# Patient Record
Sex: Male | Born: 1950 | Hispanic: No | Marital: Married | Smoking: Former smoker
Health system: Southern US, Community
[De-identification: ages and names within clinical notes are randomized; demographics above are authoritative.]

## PROBLEM LIST (undated history)

## (undated) DIAGNOSIS — E119 Type 2 diabetes mellitus without complications: Secondary | ICD-10-CM

## (undated) DIAGNOSIS — I10 Essential (primary) hypertension: Secondary | ICD-10-CM

## (undated) DIAGNOSIS — C801 Malignant (primary) neoplasm, unspecified: Secondary | ICD-10-CM

## (undated) HISTORY — PX: PROSTATE SURGERY: SHX751

## (undated) HISTORY — PX: HEART TRANSPLANT: SHX268

---

## 2005-04-10 ENCOUNTER — Other Ambulatory Visit: Payer: Self-pay

## 2005-04-11 ENCOUNTER — Inpatient Hospital Stay: Payer: Self-pay | Admitting: Internal Medicine

## 2006-10-02 ENCOUNTER — Ambulatory Visit: Payer: Self-pay | Admitting: Cardiology

## 2006-10-02 ENCOUNTER — Inpatient Hospital Stay: Payer: Self-pay | Admitting: Psychiatry

## 2006-10-02 ENCOUNTER — Other Ambulatory Visit: Payer: Self-pay

## 2006-10-03 ENCOUNTER — Other Ambulatory Visit: Payer: Self-pay

## 2006-12-20 ENCOUNTER — Ambulatory Visit: Payer: Self-pay | Admitting: Pain Medicine

## 2007-01-05 ENCOUNTER — Ambulatory Visit: Payer: Self-pay | Admitting: Pain Medicine

## 2008-04-05 ENCOUNTER — Ambulatory Visit: Payer: Self-pay | Admitting: Internal Medicine

## 2008-05-01 ENCOUNTER — Ambulatory Visit: Payer: Self-pay | Admitting: Internal Medicine

## 2012-03-25 ENCOUNTER — Ambulatory Visit: Payer: Self-pay | Admitting: Medical

## 2013-08-18 ENCOUNTER — Ambulatory Visit: Payer: Self-pay | Admitting: Otolaryngology

## 2013-08-18 LAB — CREATININE, SERUM
CREATININE: 1.25 mg/dL (ref 0.60–1.30)
EGFR (African American): >60
EGFR (Non-African Amer.): >60

## 2013-09-15 ENCOUNTER — Ambulatory Visit: Payer: Self-pay | Admitting: Otolaryngology

## 2020-11-12 ENCOUNTER — Ambulatory Visit
Admission: RE | Admit: 2020-11-12 | Discharge: 2020-11-12 | Disposition: A | Discharge status: Home / Self Care | Payer: Medicare Other | Source: Ambulatory Visit

## 2020-11-12 ENCOUNTER — Other Ambulatory Visit: Payer: Self-pay

## 2020-11-12 VITALS — BP 177/76 | HR 90 | Temp 98.9°F | Resp 18 | Ht 64.0 in | Wt 180.0 lb

## 2020-11-12 DIAGNOSIS — H6123 Impacted cerumen, bilateral: Secondary | ICD-10-CM | POA: Diagnosis not present

## 2020-11-12 HISTORY — DX: Type 2 diabetes mellitus without complications: E11.9

## 2020-11-12 HISTORY — DX: Malignant (primary) neoplasm, unspecified: C80.1

## 2020-11-12 HISTORY — DX: Essential (primary) hypertension: I10

## 2020-11-12 NOTE — ED Triage Notes (Signed)
Pt states he went to get hearing aids and was told he had ear wax build up. He states they feel full but the right is worse. No pain.

## 2020-11-12 NOTE — ED Provider Notes (Signed)
[facility]    CSN: [id number] Arrival date & time: 11/12/20  1701      History   Chief Complaint Chief Complaint  Patient presents with   Ear Fullness    bilateral    HPI Manuel Fritz is a 70 y.o. male presenting to have an otic lavage of bilateral ears.  Patient states that he has new hearing aids coming in next week and was advised to have his ears "professionally cleaned."  He says that he has cleaned them out himself at home and is got quite a lot of wax out but believes there might still be some wax in his ears.  He says he has occasional fullness but denies any pain.  No other concerns.  HPI  Past Medical History:  Diagnosis Date   Cancer ([place])    Diabetes mellitus without complication ([place])    Hypertension     There are no problems to display for this patient.   Past Surgical History:  Procedure Laterality Date   HEART TRANSPLANT     PROSTATE SURGERY         Home Medications    Prior to Admission medications   Medication Sig Start Date End Date Taking? Authorizing Provider  amLODipine (NORVASC) 5 MG tablet Take 5 mg by mouth daily. 07/22/20  Yes [provider]  BRILINTA 90 MG TABS tablet Take 90 mg by mouth 2 (two) times daily. 07/22/20  Yes [provider]  cyanocobalamin 1000 MCG tablet Take by mouth.   Yes [provider]  HUMALOG KWIKPEN 100 UNIT/ML KwikPen SMARTSIG:6-10 Unit(s) SUB-Q 3 Times Daily 07/22/20  Yes [provider]  LANTUS SOLOSTAR 100 UNIT/ML Solostar Pen SMARTSIG:18 Unit(s) SUB-Q Every Night 07/22/20  Yes [provider]  levothyroxine (SYNTHROID) 50 MCG tablet Take by mouth. 07/26/20  Yes [provider]  liothyronine (CYTOMEL) 5 MCG tablet Take 5 mcg by mouth every morning. 07/26/20  Yes [provider]  losartan (COZAAR) 50 MG tablet Take 50 mg by mouth daily. 08/13/20  Yes [provider]  Multiple Vitamin (MULTI-VITAMIN) tablet Take 1 tablet by mouth  every morning.   Yes [provider]  oxyCODONE (OXY IR/ROXICODONE) 5 MG immediate release tablet Take by mouth. 10/15/20 11/14/20 Yes [provider]  OXYCONTIN 20 MG 12 hr tablet Take 20 mg by mouth 2 (two) times daily. 09/28/20  Yes [provider]  PARoxetine (PAXIL) 40 MG tablet Take 40 mg by mouth daily. 08/09/20  Yes [provider]  predniSONE (DELTASONE) 2.5 MG tablet Take 1.5 mg by mouth daily. 07/22/20  Yes [provider]  pregabalin (LYRICA) 75 MG capsule Take by mouth. 10/23/20 10/23/21 Yes [provider]  propranolol (INDERAL) 20 MG tablet Take 20 mg by mouth 2 (two) times daily. 07/22/20  Yes [provider]  rosuvastatin (CRESTOR) 10 MG tablet Take 20 mg by mouth daily. 08/12/20  Yes [provider]  sirolimus (RAPAMUNE) 2 MG tablet Take 2 mg by mouth daily. 10/15/20  Yes [provider]  tacrolimus (PROGRAF) 1 MG capsule Take by mouth. 09/20/20 09/21/21 Yes [provider]  tamsulosin (FLOMAX) 0.4 MG CAPS capsule Take 0.4 mg by mouth daily. 08/08/20  Yes [provider]  tolterodine (DETROL LA) 4 MG 24 hr capsule Take 4 mg by mouth 2 (two) times daily. 08/09/20  Yes [provider]  traZODone (DESYREL) 150 MG tablet Take 150 mg by mouth at bedtime. 08/09/20  Yes [provider]  Family History No family history on file.  Social History Social History   Tobacco Use   Smoking status: Former    Types: Cigarettes   Smokeless tobacco: Never  Vaping Use   Vaping Use: Never used  Substance Use Topics   Alcohol use: Not Currently     Allergies   Ace inhibitors and Erythromycin   Review of Systems Review of Systems  HENT:  Positive for ear pain and hearing loss. Negative for ear discharge.   Neurological:  Negative for dizziness and headaches.    Physical Exam Triage Vital Signs ED Triage Vitals  Enc Vitals Group     BP 11/12/20 1733 (!) 177/76     Pulse Rate  11/12/20 1733 90     Resp 11/12/20 1733 18     Temp 11/12/20 1733 98.9 F (37.2 C)     Temp Source 11/12/20 1733 Oral     SpO2 11/12/20 1733 96 %     Weight 11/12/20 1724 180 lb (81.6 kg)     Height 11/12/20 1724 5\' 4"  (1.626 m)     Head Circumference --      Peak Flow --      Pain Score 11/12/20 1724 0     Pain Loc --      Pain Edu? --      Excl. in [place]? --    No data found.  Updated Vital Signs BP (!) 177/76 (BP Location: Left Arm)   Pulse 90   Temp 98.9 F (37.2 C) (Oral)   Resp 18   Ht 5\' 4"  (1.626 m)   Wt 180 lb (81.6 kg)   SpO2 96%   BMI 30.90 kg/m      Physical Exam Vitals and nursing note reviewed.  Constitutional:      General: He is not in acute distress.    Appearance: Normal appearance. He is well-developed. He is not ill-appearing.  HENT:     Head: Normocephalic and atraumatic.     Right Ear: Tympanic membrane and external ear normal.     Left Ear: Tympanic membrane and external ear normal.     Ears:     Comments: Small amount of cerumen in bilateral EACs not obstructing view of TMs Eyes:     General: No scleral icterus.    Conjunctiva/sclera: Conjunctivae normal.  Cardiovascular:     Rate and Rhythm: Normal rate and regular rhythm.  Pulmonary:     Effort: Pulmonary effort is normal. No respiratory distress.  Musculoskeletal:     Cervical back: Neck supple.  Skin:    General: Skin is warm and dry.  Neurological:     General: No focal deficit present.     Mental Status: He is alert. Mental status is at baseline.     Motor: No weakness.     Gait: Gait normal.  Psychiatric:        Mood and Affect: Mood normal.        Thought Content: Thought content normal.     UC Treatments / Results  Labs (all labs ordered are listed, but only abnormal results are displayed) Labs Reviewed - No data to display  EKG   Radiology No results found.  Procedures Procedures (including critical care time)  Medications Ordered in UC Medications - No data  to display  Initial Impression / Assessment and Plan / UC Course  I have reviewed the triage vital signs and the nursing notes.  Pertinent labs & imaging results that were available  during my care of the patient were reviewed by me and considered in my medical decision making (see chart for details).  70 year old male presenting for otic lavage.  Patient has small amount of cerumen in bilateral EACs not obstructing view of TM which is normal-appearing.  Otic lavage performed and cerumen removed.  I did use a cotton tip applicator on the left EAC to remove the small amount of cerumen.  Patient tolerated well.  Advised to follow-up as needed.   Final Clinical Impressions(s) / UC Diagnoses   Final diagnoses:  Cerumen debris on tympanic membrane of both ears   Discharge Instructions   None    ED Prescriptions   None    PDMP not reviewed this encounter.   Danton Clap, PA-C 11/12/20 1836

## 2021-02-12 ENCOUNTER — Other Ambulatory Visit: Payer: Self-pay | Admitting: Orthopedic Surgery

## 2021-02-12 DIAGNOSIS — M4856XA Collapsed vertebra, not elsewhere classified, lumbar region, initial encounter for fracture: Secondary | ICD-10-CM

## 2021-02-12 DIAGNOSIS — M4807 Spinal stenosis, lumbosacral region: Secondary | ICD-10-CM

## 2021-02-12 DIAGNOSIS — M4316 Spondylolisthesis, lumbar region: Secondary | ICD-10-CM

## 2021-02-13 ENCOUNTER — Other Ambulatory Visit: Payer: Self-pay | Admitting: Orthopedic Surgery

## 2021-02-13 DIAGNOSIS — M4807 Spinal stenosis, lumbosacral region: Secondary | ICD-10-CM

## 2021-02-19 ENCOUNTER — Ambulatory Visit
Admission: RE | Admit: 2021-02-19 | Discharge: 2021-02-19 | Disposition: A | Discharge status: Home / Self Care | Payer: Medicare Other | Source: Ambulatory Visit | Attending: Orthopedic Surgery | Admitting: Orthopedic Surgery

## 2021-02-19 ENCOUNTER — Other Ambulatory Visit: Payer: Self-pay

## 2021-02-19 DIAGNOSIS — M4807 Spinal stenosis, lumbosacral region: Secondary | ICD-10-CM | POA: Diagnosis present

## 2021-02-22 ENCOUNTER — Other Ambulatory Visit: Payer: Self-pay

## 2021-02-22 ENCOUNTER — Ambulatory Visit
Admission: RE | Admit: 2021-02-22 | Discharge: 2021-02-22 | Disposition: A | Discharge status: Home / Self Care | Payer: Medicare Other | Source: Ambulatory Visit | Attending: Orthopedic Surgery | Admitting: Orthopedic Surgery

## 2021-02-22 DIAGNOSIS — M4807 Spinal stenosis, lumbosacral region: Secondary | ICD-10-CM | POA: Insufficient documentation

## 2021-03-12 ENCOUNTER — Ambulatory Visit
Admission: RE | Admit: 2021-03-12 | Discharge: 2021-03-12 | Disposition: A | Discharge status: Home / Self Care | Payer: Medicare Other | Source: Ambulatory Visit | Attending: Orthopedic Surgery | Admitting: Orthopedic Surgery

## 2021-03-12 ENCOUNTER — Other Ambulatory Visit: Payer: Self-pay

## 2021-03-12 DIAGNOSIS — M4807 Spinal stenosis, lumbosacral region: Secondary | ICD-10-CM | POA: Diagnosis not present

## 2021-03-12 DIAGNOSIS — M4856XA Collapsed vertebra, not elsewhere classified, lumbar region, initial encounter for fracture: Secondary | ICD-10-CM | POA: Insufficient documentation

## 2021-03-12 DIAGNOSIS — M4316 Spondylolisthesis, lumbar region: Secondary | ICD-10-CM | POA: Insufficient documentation

## 2021-05-05 ENCOUNTER — Encounter: Payer: Self-pay | Admitting: Licensed Clinical Social Worker

## 2021-05-05 ENCOUNTER — Ambulatory Visit
Admission: EM | Admit: 2021-05-05 | Discharge: 2021-05-05 | Disposition: A | Discharge status: Home / Self Care | Payer: Medicare Other | Attending: Emergency Medicine | Admitting: Emergency Medicine

## 2021-05-05 DIAGNOSIS — N390 Urinary tract infection, site not specified: Secondary | ICD-10-CM | POA: Insufficient documentation

## 2021-05-05 LAB — URINALYSIS, COMPLETE (UACMP) WITH MICROSCOPIC
Bilirubin Urine: NEGATIVE
Glucose, UA: 100 mg/dL — AB
Nitrite: POSITIVE — AB
Protein, ur: >300 mg/dL — AB
RBC / HPF: >50 RBC/hpf (ref 0–5)
Specific Gravity, Urine: 1.025 (ref 1.005–1.030)
WBC, UA: >50 WBC/hpf (ref 0–5)
pH: 5.5 (ref 5.0–8.0)

## 2021-05-05 MED ORDER — CEPHALEXIN 500 MG PO CAPS: 500.0000 mg | ORAL_CAPSULE | Freq: Two times a day (BID) | ORAL | 0 refills | Status: AC

## 2021-05-05 MED ORDER — PHENAZOPYRIDINE HCL 200 MG PO TABS: 200.0000 mg | ORAL_TABLET | Freq: Three times a day (TID) | ORAL | 0 refills | Status: DC

## 2021-05-05 NOTE — ED Provider Notes (Signed)
[facility]    CSN: [id number] Arrival date & time: 05/05/21  1428      History   Chief Complaint Chief Complaint  Patient presents with   Urinary Frequency    HPI Manuel Fritz is a 71 y.o. male.   HPI  71 year old male here for evaluation of urinary complaints.  Patient reports that he woke up this morning with a feeling of urinary urgency and significant bladder pressure but whenever he tries to void he only goes a few drops.  He states that his symptoms also include seeing some blood on the pad he wears in his underwear this morning.  He denies any burning with urination, fever, nausea, or vomiting.  He has back pain at baseline but has not noticed any increase.  Past Medical History:  Diagnosis Date   Cancer ([place])    Diabetes mellitus without complication ([place])    Hypertension     There are no problems to display for this patient.   Past Surgical History:  Procedure Laterality Date   HEART TRANSPLANT     PROSTATE SURGERY         Home Medications    Prior to Admission medications   Medication Sig Start Date End Date Taking? Authorizing Provider  cephALEXin (KEFLEX) 500 MG capsule Take 1 capsule (500 mg total) by mouth 2 (two) times daily for 7 days. 05/05/21 05/12/21 Yes Margarette Canada, NP  phenazopyridine (PYRIDIUM) 200 MG tablet Take 1 tablet (200 mg total) by mouth 3 (three) times daily. 05/05/21  Yes Margarette Canada, NP  amLODipine (NORVASC) 5 MG tablet Take 5 mg by mouth daily. 07/22/20   [provider]  BRILINTA 90 MG TABS tablet Take 90 mg by mouth 2 (two) times daily. 07/22/20   [provider]  cyanocobalamin 1000 MCG tablet Take by mouth.    [provider]  HUMALOG KWIKPEN 100 UNIT/ML KwikPen SMARTSIG:6-10 Unit(s) SUB-Q 3 Times Daily 07/22/20   [provider]  LANTUS SOLOSTAR 100 UNIT/ML Solostar Pen SMARTSIG:18 Unit(s) SUB-Q Every Night 07/22/20   [provider]  levothyroxine (SYNTHROID) 50 MCG  tablet Take by mouth. 07/26/20   [provider]  liothyronine (CYTOMEL) 5 MCG tablet Take 5 mcg by mouth every morning. 07/26/20   [provider]  losartan (COZAAR) 50 MG tablet Take 50 mg by mouth daily. 08/13/20   [provider]  Multiple Vitamin (MULTI-VITAMIN) tablet Take 1 tablet by mouth every morning.    [provider]  OXYCONTIN 20 MG 12 hr tablet Take 20 mg by mouth 2 (two) times daily. 09/28/20   [provider]  PARoxetine (PAXIL) 40 MG tablet Take 40 mg by mouth daily. 08/09/20   [provider]  predniSONE (DELTASONE) 2.5 MG tablet Take 1.5 mg by mouth daily. 07/22/20   [provider]  pregabalin (LYRICA) 75 MG capsule Take by mouth. 10/23/20 10/23/21  [provider]  propranolol (INDERAL) 20 MG tablet Take 20 mg by mouth 2 (two) times daily. 07/22/20   [provider]  rosuvastatin (CRESTOR) 10 MG tablet Take 20 mg by mouth daily. 08/12/20   [provider]  sirolimus (RAPAMUNE) 2 MG tablet Take 2 mg by mouth daily. 10/15/20   [provider]  tacrolimus (PROGRAF) 1 MG capsule Take by mouth. 09/20/20 09/21/21  [provider]  tamsulosin (FLOMAX) 0.4 MG CAPS capsule Take 0.4 mg by mouth daily. 08/08/20   [provider]  tolterodine (DETROL LA) 4 MG 24  hr capsule Take 4 mg by mouth 2 (two) times daily. 08/09/20   [provider]  traZODone (DESYREL) 150 MG tablet Take 150 mg by mouth at bedtime. 08/09/20   [provider]    Family History History reviewed. No pertinent family history.  Social History Social History   Tobacco Use   Smoking status: Former    Types: Cigarettes   Smokeless tobacco: Never  Vaping Use   Vaping Use: Never used  Substance Use Topics   Alcohol use: Not Currently     Allergies   Ace inhibitors and Erythromycin   Review of Systems Review of Systems  Constitutional:  Negative for activity change, appetite change and  fever.  Gastrointestinal:  Negative for nausea and vomiting.  Genitourinary:  Positive for hematuria and urgency. Negative for dysuria and frequency.  Musculoskeletal:  Negative for back pain.  Hematological: Negative.   Psychiatric/Behavioral: Negative.      Physical Exam Triage Vital Signs ED Triage Vitals [05/05/21 1536]  Enc Vitals Group     BP      Pulse      Resp      Temp      Temp src      SpO2      Weight 179 lb 14.3 oz (81.6 kg)     Height 5\' 4"  (1.626 m)     Head Circumference      Peak Flow      Pain Score 3     Pain Loc      Pain Edu?      Excl. in [place]?    No data found.  Updated Vital Signs BP 125/87 (BP Location: Left Arm)    Pulse 99    Temp 98 F (36.7 C) (Oral)    Resp 16    Ht 5\' 4"  (1.626 m)    Wt 179 lb 14.3 oz (81.6 kg)    SpO2 100%    BMI 30.88 kg/m   Visual Acuity Right Eye Distance:   Left Eye Distance:   Bilateral Distance:    Right Eye Near:   Left Eye Near:    Bilateral Near:     Physical Exam Vitals and nursing note reviewed.  Constitutional:      Appearance: Normal appearance. He is not ill-appearing.  HENT:     Head: Normocephalic and atraumatic.  Cardiovascular:     Rate and Rhythm: Normal rate and regular rhythm.     Pulses: Normal pulses.     Heart sounds: Normal heart sounds. No murmur heard.   No friction rub. No gallop.  Pulmonary:     Effort: Pulmonary effort is normal.     Breath sounds: Normal breath sounds. No wheezing, rhonchi or rales.  Abdominal:     General: Abdomen is flat.     Palpations: Abdomen is soft.     Tenderness: There is no abdominal tenderness. There is no guarding or rebound.  Skin:    General: Skin is warm and dry.     Capillary Refill: Capillary refill takes less than 2 seconds.     Findings: No erythema or rash.  Neurological:     General: No focal deficit present.     Mental Status: He is alert and oriented to person, place, and time.  Psychiatric:        Mood and Affect: Mood normal.         Behavior: Behavior normal.        Thought Content: Thought content  normal.        Judgment: Judgment normal.     UC Treatments / Results  Labs (all labs ordered are listed, but only abnormal results are displayed) Labs Reviewed  URINALYSIS, COMPLETE (UACMP) WITH MICROSCOPIC - Abnormal; Notable for the following components:      Result Value   APPearance CLOUDY (*)    Glucose, UA 100 (*)    Hgb urine dipstick MODERATE (*)    Ketones, ur TRACE (*)    Protein, ur >300 (*)    Nitrite POSITIVE (*)    Leukocytes,Ua SMALL (*)    Bacteria, UA MANY (*)    All other components within normal limits  URINE CULTURE    EKG   Radiology No results found.  Procedures Procedures (including critical care time)  Medications Ordered in UC Medications - No data to display  Initial Impression / Assessment and Plan / UC Course  I have reviewed the triage vital signs and the nursing notes.  Pertinent labs & imaging results that were available during my care of the patient were reviewed by me and considered in my medical decision making (see chart for details).  Patient is a very pleasant, nontoxic-appearing 71 year old male here for evaluation of urinary complaints that consist of urinary urgency with small volume voids and significant bladder pressure.  He denies any burning with urination, fever, nausea, or vomiting.  He does endorse seeing some blood in the incontinence pad that he wears this morning only.  States he feels like his urine stream is being blocked.  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  The artificial structure has broken.  He came in for evaluation because he is concerned he might have an infection.  On physical exam patient has a benign cardiopulmonary exam with clear lung sounds in all fields.  Abdomen is soft and nontender.  When palpating over the suprapubic region his bladder is palpable above the pubic  symphysis.  He denies tenderness with palpation but states it does make him feel he has to urinate.  Patient was escorted to ultrasound to have a look which shows he does have urine in his bladder totaling approximately 72.1 mL.  He was able to provide a urine sample which is in the lab at present.  This makes his postvoid residual slightly above the less than 50 mL acceptable as a normal PVL but certainly not in the abnormal range of greater than 200.  Urinalysis shows cloudy appearance, 100 glucose, moderate hemoglobin, trace ketones, greater than 300 protein, nitrate positive, small leukocytes, greater than 50 WBCs, greater than 50 RBCs, many bacteria, and WBC clumps present.  We will send urine for culture.  Treat patient for UTI with Keflex 500 mg twice daily x7 days.  We will also give Pyridium to help with urinary discomfort.  Patient will be encouraged to increase his oral fluid intake so increases his urine production and flushes his urinary system.  If he continues to feel like he is unable to void he needs to go to the ER for evaluation.   Final Clinical Impressions(s) / UC Diagnoses   Final diagnoses:  Lower urinary tract infectious disease     Discharge Instructions      Take the Keflex twice daily for 7 days with food for treatment of urinary tract infection.  Use the Pyridium every 8 hours as needed for urinary discomfort.  This will turn your urine a bright  red-orange.  Increase your oral fluid intake so that you increase your urine production and or flushing your urinary system.  Take an over-the-counter probiotic, such as Culturelle-Align-Activia, 1 hour after each dose of antibiotic to prevent diarrhea or yeast infections from forming.  We will culture urine and change the antibiotics if necessary.  If you feel like your bladder is becoming distended and you are unable to urinate you need to go to the ER for evaluation because you may need to have a catheter placed at  that point.  Return for reevaluation, or see your primary care provider, for any new or worsening symptoms.      ED Prescriptions     Medication Sig Dispense Auth. Provider   cephALEXin (KEFLEX) 500 MG capsule Take 1 capsule (500 mg total) by mouth 2 (two) times daily for 7 days. 20 capsule Margarette Canada, NP   phenazopyridine (PYRIDIUM) 200 MG tablet Take 1 tablet (200 mg total) by mouth 3 (three) times daily. 6 tablet Margarette Canada, NP      PDMP not reviewed this encounter.   Margarette Canada, NP 05/05/21 [phone]

## 2021-05-05 NOTE — ED Triage Notes (Signed)
Pt c/o of urine feels like it is blocked and not getting a full stream. Pt states that it is burning.sxs started this morning.

## 2021-05-05 NOTE — Discharge Instructions (Addendum)
Take the Keflex twice daily for 7 days with food for treatment of urinary tract infection.  Use the Pyridium every 8 hours as needed for urinary discomfort.  This will turn your urine a bright red-orange.  Increase your oral fluid intake so that you increase your urine production and or flushing your urinary system.  Take an over-the-counter probiotic, such as Culturelle-Align-Activia, 1 hour after each dose of antibiotic to prevent diarrhea or yeast infections from forming.  We will culture urine and change the antibiotics if necessary.  If you feel like your bladder is becoming distended and you are unable to urinate you need to go to the ER for evaluation because you may need to have a catheter placed at that point.  Return for reevaluation, or see your primary care provider, for any new or worsening symptoms.

## 2021-05-07 LAB — URINE CULTURE: Culture: >=100000 — AB

## 2021-07-09 ENCOUNTER — Ambulatory Visit
Admission: RE | Admit: 2021-07-09 | Discharge: 2021-07-09 | Disposition: A | Discharge status: Home / Self Care | Payer: Medicare Other | Source: Ambulatory Visit | Attending: Emergency Medicine | Admitting: Emergency Medicine

## 2021-07-09 ENCOUNTER — Other Ambulatory Visit: Payer: Self-pay

## 2021-07-09 VITALS — BP 114/83 | HR 100 | Temp 98.5°F | Resp 18 | Ht 64.0 in | Wt 179.9 lb

## 2021-07-09 DIAGNOSIS — N390 Urinary tract infection, site not specified: Secondary | ICD-10-CM | POA: Diagnosis not present

## 2021-07-09 LAB — URINALYSIS, MICROSCOPIC (REFLEX)
RBC / HPF: >50 RBC/hpf (ref 0–5)
WBC, UA: >50 WBC/hpf (ref 0–5)

## 2021-07-09 LAB — URINALYSIS, ROUTINE W REFLEX MICROSCOPIC
Glucose, UA: 100 mg/dL — AB
Nitrite: NEGATIVE
Protein, ur: >300 mg/dL — AB
Specific Gravity, Urine: >1.03 — ABNORMAL HIGH (ref 1.005–1.030)
pH: 5.5 (ref 5.0–8.0)

## 2021-07-09 MED ORDER — CEPHALEXIN 500 MG PO CAPS: 500.0000 mg | ORAL_CAPSULE | Freq: Two times a day (BID) | ORAL | 0 refills | Status: AC

## 2021-07-09 MED ORDER — PHENAZOPYRIDINE HCL 200 MG PO TABS: 200.0000 mg | ORAL_TABLET | Freq: Three times a day (TID) | ORAL | 0 refills | Status: AC

## 2021-07-09 NOTE — Discharge Instructions (Signed)
Take the Keflex twice daily for 10 days with food for treatment of urinary tract infection. ? ?Use the Pyridium every 8 hours as needed for urinary discomfort.  This will turn your urine a bright red-orange. ? ?Increase your oral fluid intake so that you increase your urine production and or flushing your urinary system. ? ?Take an over-the-counter probiotic, such as Culturelle-Align-Activia, 1 hour after each dose of antibiotic to prevent diarrhea or yeast infections from forming. ? ?We will culture urine and change the antibiotics if necessary. ? ?Return for reevaluation, or see your primary care provider, for any new or worsening symptoms.  ?

## 2021-07-09 NOTE — ED Provider Notes (Signed)
?[place] ? ? ? ?CSN: [id number] ?Arrival date & time: 07/09/21  1807 ? ? ?  ? ?History   ?Chief Complaint ?Chief Complaint  ?Patient presents with  ? Urinary Frequency  ? Dysuria  ? ? ?HPI ?Manuel Fritz is a 71 y.o. male.  ? ?HPI ? ?71 year old male here for evaluation of urinary complaints. ? ?Patient reports that his symptoms began last night and they consist of urinary urgency, frequency, and burning with urination.  He reports that he has had decreased volumes with each void but is voiding more frequent.  He is also had an increase in urination at night.  He denies any fever, low back pain, nausea vomiting, or abdominal pain.  Patient has had several UTIs in the past and states this feels the same. ? ?Past Medical History:  ?Diagnosis Date  ? Cancer [facility])   ? Diabetes mellitus without complication ([place])   ? Hypertension   ? ? ?There are no problems to display for this patient. ? ? ?Past Surgical History:  ?Procedure Laterality Date  ? HEART TRANSPLANT    ? PROSTATE SURGERY    ? ? ? ? ? ?Home Medications   ? ?Prior to Admission medications   ?Medication Sig Start Date End Date Taking? Authorizing Provider  ?cephALEXin (KEFLEX) 500 MG capsule Take 1 capsule (500 mg total) by mouth 2 (two) times daily for 10 days. 07/09/21 07/19/21 Yes Margarette Canada, NP  ?phenazopyridine (PYRIDIUM) 200 MG tablet Take 1 tablet (200 mg total) by mouth 3 (three) times daily. 07/09/21  Yes Margarette Canada, NP  ?amLODipine (NORVASC) 5 MG tablet Take 5 mg by mouth daily. 07/22/20   [provider]  ?BRILINTA 90 MG TABS tablet Take 90 mg by mouth 2 (two) times daily. 07/22/20   [provider]  ?cyanocobalamin 1000 MCG tablet Take by mouth.    [provider]  ?Cleda Clarks 100 UNIT/ML KwikPen SMARTSIG:6-10 Unit(s) SUB-Q 3 Times Daily 07/22/20   [provider]  ?LANTUS SOLOSTAR 100 UNIT/ML Solostar Pen SMARTSIG:18 Unit(s) SUB-Q Every Night 07/22/20   [provider]  ?levothyroxine  (SYNTHROID) 50 MCG tablet Take by mouth. 07/26/20   [provider]  ?liothyronine (CYTOMEL) 5 MCG tablet Take 5 mcg by mouth every morning. 07/26/20   [provider]  ?losartan (COZAAR) 50 MG tablet Take 50 mg by mouth daily. 08/13/20   [provider]  ?Multiple Vitamin (MULTI-VITAMIN) tablet Take 1 tablet by mouth every morning.    [provider]  ?OXYCONTIN 20 MG 12 hr tablet Take 20 mg by mouth 2 (two) times daily. 09/28/20   [provider]  ?PARoxetine (PAXIL) 40 MG tablet Take 40 mg by mouth daily. 08/09/20   [provider]  ?predniSONE (DELTASONE) 2.5 MG tablet Take 1.5 mg by mouth daily. 07/22/20   [provider]  ?pregabalin (LYRICA) 75 MG capsule Take by mouth. 10/23/20 10/23/21  [provider]  ?propranolol (INDERAL) 20 MG tablet Take 20 mg by mouth 2 (two) times daily. 07/22/20   [provider]  ?rosuvastatin (CRESTOR) 10 MG tablet Take 20 mg by mouth daily. 08/12/20   [provider]  ?sirolimus (RAPAMUNE) 2 MG tablet Take 2 mg by mouth daily. 10/15/20   [provider]  ?tacrolimus (PROGRAF) 1 MG capsule Take by mouth. 09/20/20 09/21/21  [provider]  ?tamsulosin (FLOMAX) 0.4 MG CAPS capsule Take 0.4 mg by mouth daily. 08/08/20   [provider]  ?tolterodine (DETROL LA)  4 MG 24 hr capsule Take 4 mg by mouth 2 (two) times daily. 08/09/20   [provider]  ?traZODone (DESYREL) 150 MG tablet Take 150 mg by mouth at bedtime. 08/09/20   [provider]  ? ? ?Family History ?History reviewed. No pertinent family history. ? ?Social History ?Social History  ? ?Tobacco Use  ? Smoking status: Former  ?  Types: Cigarettes  ? Smokeless tobacco: Never  ?Vaping Use  ? Vaping Use: Never used  ?Substance Use Topics  ? Alcohol use: Not Currently  ? ? ? ?Allergies   ?Ace inhibitors and Erythromycin ? ? ?Review of Systems ?Review of Systems  ?Constitutional:  Negative for fever.   ?Gastrointestinal:  Negative for abdominal pain, nausea and vomiting.  ?Genitourinary:  Positive for dysuria, frequency and urgency. Negative for hematuria.  ?Musculoskeletal:  Negative for back pain.  ?Skin:  Negative for rash.  ?Hematological: Negative.   ? ? ?Physical Exam ?Triage Vital Signs ?ED Triage Vitals  ?Enc Vitals Group  ?   BP 07/09/21 1851 114/83  ?   Pulse Rate 07/09/21 1851 100  ?   Resp 07/09/21 1851 18  ?   Temp 07/09/21 1851 98.5 ?F (36.9 ?C)  ?   Temp Source 07/09/21 1851 Oral  ?   SpO2 07/09/21 1851 97 %  ?   Weight 07/09/21 1849 179 lb 14.3 oz (81.6 kg)  ?   Height 07/09/21 1849 '5\' 4"'$  (1.626 m)  ?   Head Circumference --   ?   Peak Flow --   ?   Pain Score 07/09/21 1849 0  ?   Pain Loc --   ?   Pain Edu? --   ?   Excl. in [place]? --   ? ?No data found. ? ?Updated Vital Signs ?BP 114/83 (BP Location: Left Arm)   Pulse 100   Temp 98.5 ?F (36.9 ?C) (Oral)   Resp 18   Ht '5\' 4"'$  (1.626 m)   Wt 179 lb 14.3 oz (81.6 kg)   SpO2 97%   BMI 30.88 kg/m?  ? ?Visual Acuity ?Right Eye Distance:   ?Left Eye Distance:   ?Bilateral Distance:   ? ?Right Eye Near:   ?Left Eye Near:    ?Bilateral Near:    ? ?Physical Exam ?Vitals and nursing note reviewed.  ?Constitutional:   ?   Appearance: Normal appearance. He is not ill-appearing.  ?HENT:  ?   Head: Normocephalic and atraumatic.  ?Cardiovascular:  ?   Rate and Rhythm: Normal rate and regular rhythm.  ?   Pulses: Normal pulses.  ?   Heart sounds: Normal heart sounds. No murmur heard. ?  No friction rub. No gallop.  ?Pulmonary:  ?   Effort: Pulmonary effort is normal.  ?   Breath sounds: Normal breath sounds. No wheezing, rhonchi or rales.  ?Abdominal:  ?   Tenderness: There is no right CVA tenderness or left CVA tenderness.  ?Skin: ?   General: Skin is warm and dry.  ?   Capillary Refill: Capillary refill takes less than 2 seconds.  ?   Findings: No erythema or rash.  ?Neurological:  ?   General: No focal deficit present.  ?   Mental Status: He is alert and  oriented to person, place, and time.  ?Psychiatric:     ?   Mood and Affect: Mood normal.     ?   Behavior: Behavior normal.     ?   Thought Content:  Thought content normal.     ?   Judgment: Judgment normal.  ? ? ? ?UC Treatments / Results  ?Labs ?(all labs ordered are listed, but only abnormal results are displayed) ?Labs Reviewed  ?URINALYSIS, ROUTINE W REFLEX MICROSCOPIC - Abnormal; Notable for the following components:  ?    Result Value  ? APPearance HAZY (*)   ? Specific Gravity, Urine >1.030 (*)   ? Glucose, UA 100 (*)   ? Hgb urine dipstick LARGE (*)   ? Bilirubin Urine SMALL (*)   ? Ketones, ur TRACE (*)   ? Protein, ur >300 (*)   ? Leukocytes,Ua TRACE (*)   ? All other components within normal limits  ?URINALYSIS, MICROSCOPIC (REFLEX) - Abnormal; Notable for the following components:  ? Bacteria, UA FEW (*)   ? All other components within normal limits  ? ? ?EKG ? ? ?Radiology ?No results found. ? ?Procedures ?Procedures (including critical care time) ? ?Medications Ordered in UC ?Medications - No data to display ? ?Initial Impression / Assessment and Plan / UC Course  ?I have reviewed the triage vital signs and the nursing notes. ? ?Pertinent labs & imaging results that were available during my care of the patient were reviewed by me and considered in my medical decision making (see chart for details). ? ?Patient is a nontoxic-appearing 71 year old male here for evaluation of urinary complaints as outlined in HPI above.  Physical exam reveals benign cardiopulmonary exam with clear lung sounds in all fields.  Patient has no CVA tenderness on exam.  Abdomen is soft and nontender.  We will send urine for analysis. ? ?Urinalysis shows hazy appearance, 100 glucose, large hemoglobin, small bilirubin, trace ketones, greater than 300 protein, trace leukocytes.  Nitrite negative.  Urine micro shows greater than 50 RBCs and greater than 50 WBCs with WBC clumps.  Few bacteria.  We will send urine for culture. ? ?We  will treat patient for a urinary tract infection with Keflex twice daily.  He reports that he usually responds better to a 10-day course so I will send a 10-day course to the pharmacy.  I will also prescribe Pyridium to hel

## 2021-07-09 NOTE — ED Triage Notes (Signed)
Pt reports urinary frequency, burning with urination and oliguria starting last night.  ?

## 2021-07-12 LAB — URINE CULTURE: Culture: 10000 — AB

## 2023-01-15 ENCOUNTER — Encounter: Payer: Self-pay | Admitting: Emergency Medicine

## 2023-01-15 ENCOUNTER — Other Ambulatory Visit: Payer: Self-pay

## 2023-01-15 ENCOUNTER — Emergency Department
Admission: EM | Admit: 2023-01-15 | Discharge: 2023-01-15 | Disposition: A | Discharge status: Home / Self Care | Payer: Medicare Other | Attending: Emergency Medicine | Admitting: Emergency Medicine

## 2023-01-15 ENCOUNTER — Emergency Department: Payer: Medicare Other

## 2023-01-15 DIAGNOSIS — R109 Unspecified abdominal pain: Secondary | ICD-10-CM

## 2023-01-15 DIAGNOSIS — K8689 Other specified diseases of pancreas: Secondary | ICD-10-CM | POA: Insufficient documentation

## 2023-01-15 DIAGNOSIS — E119 Type 2 diabetes mellitus without complications: Secondary | ICD-10-CM | POA: Diagnosis not present

## 2023-01-15 DIAGNOSIS — K869 Disease of pancreas, unspecified: Secondary | ICD-10-CM

## 2023-01-15 LAB — URINALYSIS, ROUTINE W REFLEX MICROSCOPIC
Bacteria, UA: NONE SEEN
Bilirubin Urine: NEGATIVE
Glucose, UA: >=500 mg/dL — AB
Ketones, ur: 20 mg/dL — AB
Leukocytes,Ua: NEGATIVE
Nitrite: NEGATIVE
Protein, ur: 100 mg/dL — AB
Specific Gravity, Urine: 1.021 (ref 1.005–1.030)
Squamous Epithelial / HPF: 0 /HPF (ref 0–5)
pH: 5 (ref 5.0–8.0)

## 2023-01-15 LAB — COMPREHENSIVE METABOLIC PANEL
ALT: 33 U/L (ref 0–44)
AST: 28 U/L (ref 15–41)
Albumin: 3.8 g/dL (ref 3.5–5.0)
Alkaline Phosphatase: 85 U/L (ref 38–126)
Anion gap: 13 (ref 5–15)
BUN: 30 mg/dL — ABNORMAL HIGH (ref 8–23)
CO2: 20 mmol/L — ABNORMAL LOW (ref 22–32)
Calcium: 9.1 mg/dL (ref 8.9–10.3)
Chloride: 101 mmol/L (ref 98–111)
Creatinine, Ser: 1.22 mg/dL (ref 0.61–1.24)
GFR, Estimated: >60 mL/min (ref >60)
Glucose, Bld: 259 mg/dL — ABNORMAL HIGH (ref 70–99)
Potassium: 3.4 mmol/L — ABNORMAL LOW (ref 3.5–5.1)
Sodium: 134 mmol/L — ABNORMAL LOW (ref 135–145)
Total Bilirubin: 0.7 mg/dL (ref 0.3–1.2)
Total Protein: 7.8 g/dL (ref 6.5–8.1)

## 2023-01-15 LAB — CBC WITH DIFFERENTIAL/PLATELET
Abs Immature Granulocytes: 0.04 10*3/uL (ref 0.00–0.07)
Basophils Absolute: 0.1 10*3/uL (ref 0.0–0.1)
Basophils Relative: 1 %
Eosinophils Absolute: 0.2 10*3/uL (ref 0.0–0.5)
Eosinophils Relative: 2 %
HCT: 39.8 % (ref 39.0–52.0)
Hemoglobin: 13.5 g/dL (ref 13.0–17.0)
Immature Granulocytes: 0 %
Lymphocytes Relative: 10 %
Lymphs Abs: 1 10*3/uL (ref 0.7–4.0)
MCH: 27.3 pg (ref 26.0–34.0)
MCHC: 33.9 g/dL (ref 30.0–36.0)
MCV: 80.4 fL (ref 80.0–100.0)
Monocytes Absolute: 1 10*3/uL (ref 0.1–1.0)
Monocytes Relative: 9 %
Neutro Abs: 8.3 10*3/uL — ABNORMAL HIGH (ref 1.7–7.7)
Neutrophils Relative %: 78 %
Platelets: 225 10*3/uL (ref 150–400)
RBC: 4.95 MIL/uL (ref 4.22–5.81)
RDW: 13.2 % (ref 11.5–15.5)
WBC: 10.6 10*3/uL — ABNORMAL HIGH (ref 4.0–10.5)
nRBC: 0 % (ref 0.0–0.2)

## 2023-01-15 LAB — LIPASE, BLOOD: Lipase: 31 U/L (ref 11–51)

## 2023-01-15 LAB — TROPONIN I (HIGH SENSITIVITY): Troponin I (High Sensitivity): 6 ng/L (ref <18)

## 2023-01-15 MED ORDER — IOHEXOL 300 MG/ML  SOLN
100.0000 mL | Freq: Once | INTRAMUSCULAR | Status: AC | PRN
  Administered 2023-01-15: 100 mL via INTRAVENOUS

## 2023-01-15 MED ORDER — SODIUM CHLORIDE 0.9 % IV BOLUS
500.0000 mL | Freq: Once | INTRAVENOUS | Status: AC
  Administered 2023-01-15: 500 mL via INTRAVENOUS

## 2023-01-15 MED ORDER — MORPHINE SULFATE (PF) 4 MG/ML IV SOLN
4.0000 mg | Freq: Once | INTRAVENOUS | Status: AC
  Administered 2023-01-15: 4 mg via INTRAVENOUS
  Filled 2023-01-15: qty 1

## 2023-01-15 MED ORDER — MORPHINE SULFATE (PF) 4 MG/ML IV SOLN
8.0000 mg | Freq: Once | INTRAVENOUS | Status: AC
  Administered 2023-01-15: 8 mg via INTRAVENOUS
  Filled 2023-01-15: qty 2

## 2023-01-15 NOTE — ED Provider Notes (Signed)
[facility] Provider Note    Event Date/Time   First MD Initiated Contact with Patient 01/15/23 [phone]     (approximate)   History   Flank Pain   HPI  Manuel Fritz is a 72 y.o. male past medical history significant for prior heart transplant on immunosuppressive medications, spinal stenosis, chronic low back pain, diabetes, GERD, who presents to the emergency department with left-sided abdominal pain.  Left-sided abdominal pain that started last night and has been ongoing since that time.  No other associated symptoms of nausea, vomiting or diarrhea.  Did state that he took Imodium yesterday for diarrhea.  Denies any blood in his stool or melena.  Prior cholecystectomy.  No prior similar symptoms in the past.  Denies any fever or chills.  Denies any urinary symptoms.  No prior history of kidney stones.     Physical Exam   Triage Vital Signs: ED Triage Vitals  Encounter Vitals Group     BP 01/15/23 0620 (!) 187/96     Systolic BP Percentile --      Diastolic BP Percentile --      Pulse Rate 01/15/23 0620 77     Resp --      Temp 01/15/23 0620 98.4 F (36.9 C)     Temp Source 01/15/23 0620 Oral     SpO2 01/15/23 0620 98 %     Weight 01/15/23 0614 180 lb (81.6 kg)     Height 01/15/23 0614 5\' 6"  (1.676 m)     Head Circumference --      Peak Flow --      Pain Score 01/15/23 0614 8     Pain Loc --      Pain Education --      Exclude from Growth Chart --     Most recent vital signs: Vitals:   01/15/23 0620  BP: (!) 187/96  Pulse: 77  Temp: 98.4 F (36.9 C)  SpO2: 98%    Physical Exam Constitutional:      Appearance: He is well-developed.  HENT:     Head: Atraumatic.  Eyes:     Conjunctiva/sclera: Conjunctivae normal.  Cardiovascular:     Rate and Rhythm: Regular rhythm.  Pulmonary:     Effort: No respiratory distress.  Abdominal:     Tenderness: There is abdominal tenderness (Left lower quadrant).  Musculoskeletal:     Cervical  back: Normal range of motion.  Skin:    General: Skin is warm.  Neurological:     Mental Status: He is alert. Mental status is at baseline.     IMPRESSION / MDM / ASSESSMENT AND PLAN / ED COURSE  I reviewed the triage vital signs and the nursing notes.  Differential diagnosis including diverticulitis, intra-abdominal abscess, constipation, bowel obstruction, kidney stone, pyelonephritis, ACS  EKG  I, Corena Herter, the attending physician, personally viewed and interpreted this ECG.   Rate: Normal  Rhythm: Normal sinus  Axis: Normal  Intervals: Prolonged PR interval  ST&T Change: None  No tachycardic or bradycardic dysrhythmias while on cardiac telemetry.  RADIOLOGY I independently reviewed imaging, my interpretation of imaging: CT abdomen and pelvis with contrast -without obvious acute findings to explain the patient's pain today.  Called and discussed the radiology read with the radiologist on-call.  Multiple abnormalities found on outside hospital CT scan, read is only available but no images.  When reviewing the CT scan read from outside hospital and the read from the CT scan today given  that the lipase level is normal without findings concerning for an acute pancreatitis felt that these were stable and could have an outpatient MRI or could get the disc and compare CT images once uploaded in the system.  Discussed incidental findings with the patient.  Discussed that he could have a disc made through our radiology department and take to [facility] for further comparison for stability given his recent CT scan in May versus having the disc from [facility] brought over here for radiologist to compare.  LABS (all labs ordered are listed, but only abnormal results are displayed) Labs interpreted as -    Labs Reviewed  CBC WITH DIFFERENTIAL/PLATELET - Abnormal; Notable for the following components:      Result Value   WBC 10.6 (*)    Neutro Abs 8.3 (*)    All other components within normal  limits  COMPREHENSIVE METABOLIC PANEL - Abnormal; Notable for the following components:   Sodium 134 (*)    Potassium 3.4 (*)    CO2 20 (*)    Glucose, Bld 259 (*)    BUN 30 (*)    All other components within normal limits  URINALYSIS, ROUTINE W REFLEX MICROSCOPIC - Abnormal; Notable for the following components:   Color, Urine YELLOW (*)    APPearance CLEAR (*)    Glucose, UA >=500 (*)    Hgb urine dipstick MODERATE (*)    Ketones, ur 20 (*)    Protein, ur 100 (*)    All other components within normal limits  LIPASE, BLOOD  TACROLIMUS LEVEL  TROPONIN I (HIGH SENSITIVITY)     MDM  Mild leukocytosis.  No findings of pyelonephritis.  No obvious findings of acute diverticulitis or intra-abdominal abscess.  Multiple findings on CT scan that was discussed with the radiologist and the patient.  When comparing CT scan done in May 2024 at outside hospital appeared to have stable lesions in the pancreas, kidney and liver.  Discussed close follow-up with primary care physician to establish care.  Also given a referral given he does not have a primary care physician in this area.  Discussed close follow-up with gastroenterology and the possible need for further MRCP or ERCP for further evaluation.  Discussed return to the emergency department if he had ongoing symptoms.  No questions at time of discharge.     PROCEDURES:  Critical Care performed: No  Procedures  Patient's presentation is most consistent with acute presentation with potential threat to life or bodily function.   MEDICATIONS ORDERED IN ED: Medications  morphine (PF) 4 MG/ML injection 4 mg (4 mg Intravenous Given 01/15/23 0752)  sodium chloride 0.9 % bolus 500 mL (500 mLs Intravenous New Bag/Given 01/15/23 0751)  iohexol (OMNIPAQUE) 300 MG/ML solution 100 mL (100 mLs Intravenous Contrast Given 01/15/23 0758)  morphine (PF) 4 MG/ML injection 8 mg (8 mg Intravenous Given 01/15/23 0957)    FINAL CLINICAL IMPRESSION(S) / ED  DIAGNOSES   Final diagnoses:  Left sided abdominal pain  Lesion of pancreas     Rx / DC Orders   ED Discharge Orders          Ordered    Ambulatory Referral to Primary Care (Establish Care)        01/15/23 1026             Note:  This document was prepared using Dragon voice recognition software and may include unintentional dictation errors.   Corena Herter, MD 01/15/23 1040

## 2023-01-15 NOTE — ED Triage Notes (Signed)
Patient ambulatory to triage with steady gait, without difficulty or distress noted; st since last night having left flank pain radiating into abd with no accomp symptoms; denies hx of same

## 2023-01-15 NOTE — Discharge Instructions (Addendum)
You were seen in the emergency department for abdominal pain.  You had multiple findings on your CT scan that appeared stable when compared to your last CT scan in May done at [facility].  You can discuss with the radiology department about making a disc with [facility] or with [place] so that they can compare the images uploaded to make sure there are no changes.  If you have not had an MRI done of your abdomen and pelvis recently it is important that you follow-up to make sure that this does not need to be done as an outpatient.  You can talk with your urology team/cardiologist about further follow-up.  You are given information to follow-up with gastroenterology, make a phone call today so they can schedule a close follow-up appointment.  You are also given a referral for primary care physician and information for primary care providers in the area. If you have ongoing pain, you need to return to the ER for re-evaluation.   Thank you for choosing [place] for your health care, it was my pleasure to care for you today!  Corena Herter, MD

## 2023-01-18 LAB — TACROLIMUS LEVEL: Tacrolimus (FK506) - LabCorp: 21.6 ng/mL — ABNORMAL HIGH (ref 2.0–20.0)

## 2023-03-31 ENCOUNTER — Ambulatory Visit
Admission: RE | Admit: 2023-03-31 | Discharge: 2023-03-31 | Disposition: A | Discharge status: Home / Self Care | Payer: Medicare Other | Attending: Urology | Admitting: Urology

## 2023-03-31 ENCOUNTER — Other Ambulatory Visit: Payer: Self-pay | Admitting: Urology

## 2023-03-31 ENCOUNTER — Ambulatory Visit
Admission: RE | Admit: 2023-03-31 | Discharge: 2023-03-31 | Disposition: A | Discharge status: Home / Self Care | Payer: Medicare Other | Source: Ambulatory Visit | Attending: Urology | Admitting: Urology

## 2023-03-31 DIAGNOSIS — N393 Stress incontinence (female) (male): Secondary | ICD-10-CM

## 2023-07-17 IMAGING — CT CT L SPINE W/O CM
1 of 7 series · 6 of 14 positions shown, 8 images · non-contrast
Comparison: None.

CLINICAL DATA: Low back pain for 5 months status post lumbar
fusion.

EXAM:
CT LUMBAR SPINE WITHOUT CONTRAST
TECHNIQUE: Multidetector CT imaging of the lumbar spine was performed without
intravenous contrast administration. Multiplanar CT image
reconstructions were also generated.

[Series 3: l spine soft · axial · 0.37mm/px · z∈[-912,-686]mm · 6 of 159 slices shown, 8 images]
[im 23/159  soft-tissue]
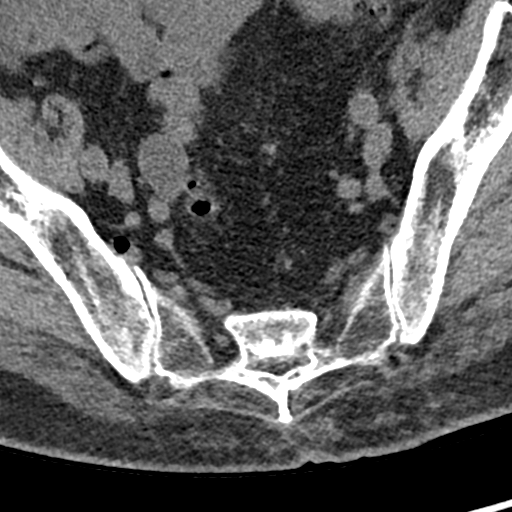
[im 23/159  bone]
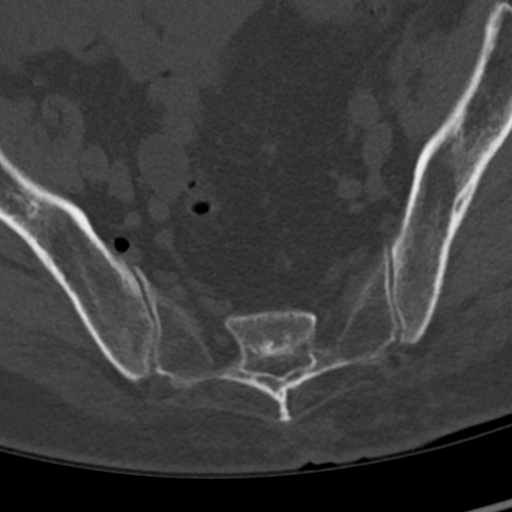
[im 46/159  bone]
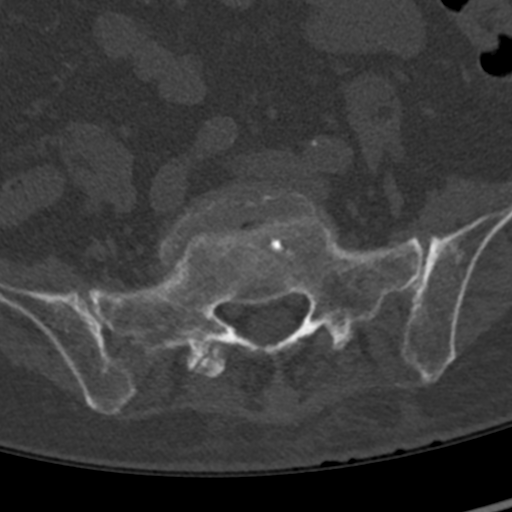
[im 68/159  bone]
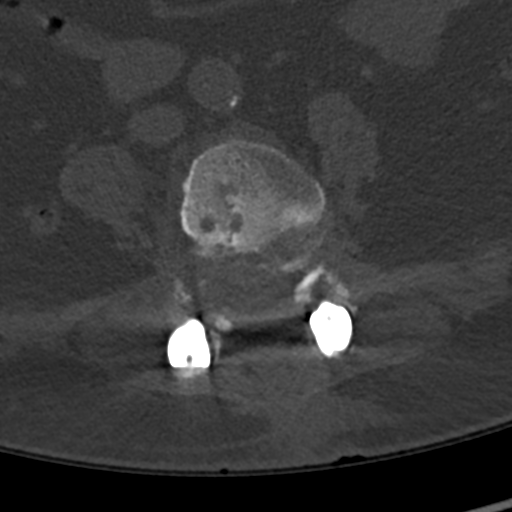
[im 91/159  bone]
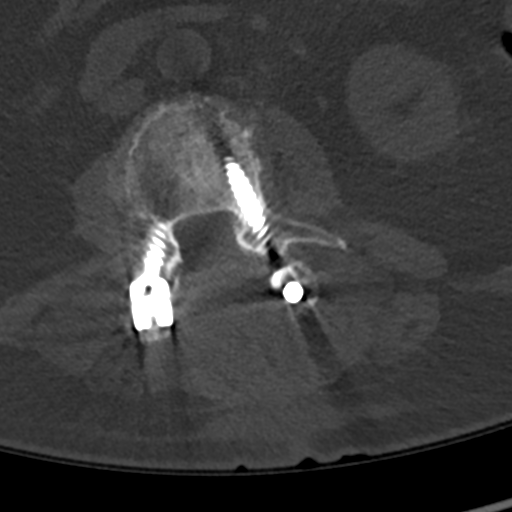
[im 113/159  soft-tissue]
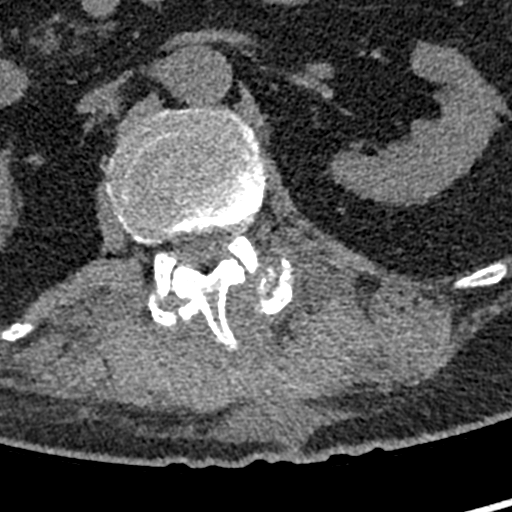
[im 113/159  bone]
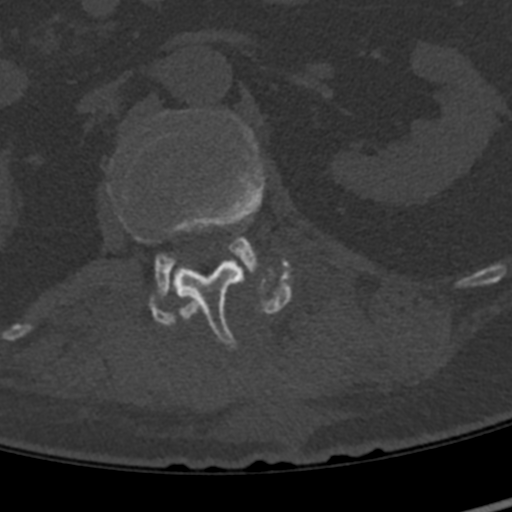
[im 136/159  bone]
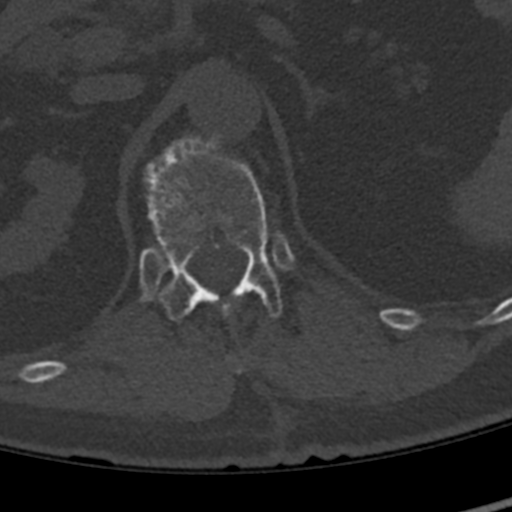

[6 of 14 positions shown; findings below may reference images not displayed]

FINDINGS: Segmentation: 5 lumbar type vertebrae.

Alignment: Dextroscoliosis of the thoracolumbar spine. Grade 1
anterolisthesis of L3 on L4 and L4 on L5.

Vertebrae: No acute fracture or aggressive osseous lesion.

Paraspinal and other soft tissues: Negative.

Other: Mild osteoarthritis of bilateral SI joints.

Disc levels:

Disc spaces: Posterior lumbar interbody fusion and decompression
from L2 through L5.

T12-L1: No disc protrusion, foraminal stenosis or central canal
stenosis.

L1-L2: Minimal broad-based disc bulge. Mild bilateral facet
arthropathy. No foraminal or central canal stenosis.

L2-L3: Interbody fusion. Foramina and central canal are not well
evaluated by CT.

L3-L4: Interbody fusion. Foramina and central canal are not well
evaluated by CT.

L4-L5: Interbody fusion. Foramina and central canal are not well
evaluated by CT.

L5-S1: Minimal broad-based disc bulge. Moderate bilateral facet
arthropathy. No foraminal stenosis.
IMPRESSION: 1.  No acute osseous injury of the lumbar spine.
2. Posterior lumbar interbody fusion from L2 through L5. No hardware
failure or complication. If there is clinical concern regarding
foraminal or central canal pathology, recommend evaluation with a
MRI of the lumbar spine or CT myelogram.
